# Patient Record
Sex: Female | Born: 1975 | Race: Black or African American | Hispanic: No | Marital: Married | Smoking: Never smoker
Health system: Southern US, Community
[De-identification: ages and names within clinical notes are randomized; demographics above are authoritative.]

## PROBLEM LIST (undated history)

## (undated) DIAGNOSIS — G43909 Migraine, unspecified, not intractable, without status migrainosus: Secondary | ICD-10-CM

## (undated) DIAGNOSIS — Z6841 Body Mass Index (BMI) 40.0 and over, adult: Secondary | ICD-10-CM

## (undated) HISTORY — DX: Migraine, unspecified, not intractable, without status migrainosus: G43.909

## (undated) HISTORY — PX: TONSILLECTOMY: SUR1361

---

## 2000-04-11 ENCOUNTER — Other Ambulatory Visit: Admission: RE | Admit: 2000-04-11 | Discharge: 2000-04-11 | Payer: Self-pay | Admitting: Obstetrics and Gynecology

## 2000-10-30 ENCOUNTER — Inpatient Hospital Stay (HOSPITAL_COMMUNITY): Admission: AD | Admit: 2000-10-30 | Discharge: 2000-11-03 | Payer: Self-pay | Admitting: Obstetrics and Gynecology

## 2001-04-18 ENCOUNTER — Other Ambulatory Visit: Admission: RE | Admit: 2001-04-18 | Discharge: 2001-04-18 | Payer: Self-pay | Admitting: Obstetrics and Gynecology

## 2002-05-14 ENCOUNTER — Other Ambulatory Visit: Admission: RE | Admit: 2002-05-14 | Discharge: 2002-05-14 | Payer: Self-pay | Admitting: Obstetrics and Gynecology

## 2003-05-27 ENCOUNTER — Other Ambulatory Visit: Admission: RE | Admit: 2003-05-27 | Discharge: 2003-05-27 | Payer: Self-pay | Admitting: Obstetrics and Gynecology

## 2004-05-27 ENCOUNTER — Other Ambulatory Visit: Admission: RE | Admit: 2004-05-27 | Discharge: 2004-05-27 | Payer: Self-pay | Admitting: Obstetrics and Gynecology

## 2004-06-22 ENCOUNTER — Encounter: Admission: RE | Admit: 2004-06-22 | Discharge: 2004-09-20 | Payer: Self-pay | Admitting: Obstetrics and Gynecology

## 2004-07-29 ENCOUNTER — Ambulatory Visit (HOSPITAL_COMMUNITY): Admission: RE | Admit: 2004-07-29 | Discharge: 2004-07-29 | Payer: Self-pay | Admitting: Obstetrics and Gynecology

## 2004-11-14 ENCOUNTER — Inpatient Hospital Stay (HOSPITAL_COMMUNITY): Admission: AD | Admit: 2004-11-14 | Discharge: 2004-11-14 | Payer: Self-pay | Admitting: Obstetrics and Gynecology

## 2005-01-07 ENCOUNTER — Inpatient Hospital Stay (HOSPITAL_COMMUNITY): Admission: AD | Admit: 2005-01-07 | Discharge: 2005-01-07 | Payer: Self-pay | Admitting: Obstetrics and Gynecology

## 2005-02-03 ENCOUNTER — Inpatient Hospital Stay (HOSPITAL_COMMUNITY): Admission: AD | Admit: 2005-02-03 | Discharge: 2005-02-08 | Payer: Self-pay | Admitting: Obstetrics and Gynecology

## 2005-02-09 ENCOUNTER — Encounter: Admission: RE | Admit: 2005-02-09 | Discharge: 2005-03-11 | Payer: Self-pay | Admitting: Obstetrics and Gynecology

## 2005-05-28 ENCOUNTER — Other Ambulatory Visit: Admission: RE | Admit: 2005-05-28 | Discharge: 2005-05-28 | Payer: Self-pay | Admitting: Obstetrics and Gynecology

## 2005-06-11 ENCOUNTER — Encounter: Admission: RE | Admit: 2005-06-11 | Discharge: 2005-09-09 | Payer: Self-pay | Admitting: Obstetrics and Gynecology

## 2010-06-21 ENCOUNTER — Encounter: Payer: Self-pay | Admitting: Obstetrics and Gynecology

## 2015-05-14 ENCOUNTER — Ambulatory Visit
Admission: RE | Admit: 2015-05-14 | Discharge: 2015-05-14 | Disposition: A | Discharge status: Home / Self Care | Payer: 59 | Source: Ambulatory Visit | Attending: Family Medicine | Admitting: Family Medicine

## 2015-05-14 ENCOUNTER — Other Ambulatory Visit: Payer: Self-pay | Admitting: Family Medicine

## 2015-05-14 DIAGNOSIS — R52 Pain, unspecified: Secondary | ICD-10-CM

## 2015-05-14 DIAGNOSIS — M7712 Lateral epicondylitis, left elbow: Secondary | ICD-10-CM

## 2015-10-06 ENCOUNTER — Telehealth: Payer: Self-pay | Admitting: Hematology

## 2015-10-06 ENCOUNTER — Encounter: Payer: Self-pay | Admitting: Hematology

## 2015-10-06 NOTE — Telephone Encounter (Signed)
Verified address and insurance, faxed referring provider date/time of appt, mailed new pt packet , scheduled intake

## 2015-10-21 ENCOUNTER — Telehealth: Payer: Self-pay | Admitting: Hematology

## 2015-10-21 ENCOUNTER — Ambulatory Visit (HOSPITAL_BASED_OUTPATIENT_CLINIC_OR_DEPARTMENT_OTHER): Payer: 59 | Admitting: Hematology

## 2015-10-21 ENCOUNTER — Encounter: Payer: Self-pay | Admitting: Hematology

## 2015-10-21 ENCOUNTER — Other Ambulatory Visit (HOSPITAL_BASED_OUTPATIENT_CLINIC_OR_DEPARTMENT_OTHER): Payer: 59

## 2015-10-21 VITALS — BP 134/87 | HR 85 | Temp 99.0°F | Resp 18 | Wt 270.3 lb

## 2015-10-21 DIAGNOSIS — D72828 Other elevated white blood cell count: Secondary | ICD-10-CM

## 2015-10-21 DIAGNOSIS — G43909 Migraine, unspecified, not intractable, without status migrainosus: Secondary | ICD-10-CM | POA: Insufficient documentation

## 2015-10-21 DIAGNOSIS — J309 Allergic rhinitis, unspecified: Secondary | ICD-10-CM

## 2015-10-21 DIAGNOSIS — E282 Polycystic ovarian syndrome: Secondary | ICD-10-CM

## 2015-10-21 DIAGNOSIS — E669 Obesity, unspecified: Secondary | ICD-10-CM

## 2015-10-21 DIAGNOSIS — D72829 Elevated white blood cell count, unspecified: Secondary | ICD-10-CM | POA: Insufficient documentation

## 2015-10-21 DIAGNOSIS — G43809 Other migraine, not intractable, without status migrainosus: Secondary | ICD-10-CM

## 2015-10-21 DIAGNOSIS — D509 Iron deficiency anemia, unspecified: Secondary | ICD-10-CM

## 2015-10-21 DIAGNOSIS — E559 Vitamin D deficiency, unspecified: Secondary | ICD-10-CM | POA: Insufficient documentation

## 2015-10-21 DIAGNOSIS — M7712 Lateral epicondylitis, left elbow: Secondary | ICD-10-CM | POA: Insufficient documentation

## 2015-10-21 LAB — CBC & DIFF AND RETIC
BASO%: 0.5 % (ref 0.0–2.0)
Basophils Absolute: 0.1 10*3/uL (ref 0.0–0.1)
EOS%: 1.5 % (ref 0.0–7.0)
Eosinophils Absolute: 0.2 10*3/uL (ref 0.0–0.5)
HCT: 36.9 % (ref 34.8–46.6)
HGB: 11.9 g/dL (ref 11.6–15.9)
Immature Retic Fract: 3.9 % (ref 1.60–10.00)
LYMPH#: 3.1 10*3/uL (ref 0.9–3.3)
LYMPH%: 21 % (ref 14.0–49.7)
MCH: 23.5 pg — ABNORMAL LOW (ref 25.1–34.0)
MCHC: 32.2 g/dL (ref 31.5–36.0)
MCV: 72.8 fL — ABNORMAL LOW (ref 79.5–101.0)
MONO#: 1 10*3/uL — ABNORMAL HIGH (ref 0.1–0.9)
MONO%: 7 % (ref 0.0–14.0)
NEUT%: 70 % (ref 38.4–76.8)
NEUTROS ABS: 10.4 10*3/uL — AB (ref 1.5–6.5)
Platelets: 287 10*3/uL (ref 145–400)
RBC: 5.07 10*6/uL (ref 3.70–5.45)
RDW: 16.5 % — AB (ref 11.2–14.5)
RETIC %: 0.84 % (ref 0.70–2.10)
RETIC CT ABS: 42.59 10*3/uL (ref 33.70–90.70)
WBC: 14.9 10*3/uL — ABNORMAL HIGH (ref 3.9–10.3)

## 2015-10-21 LAB — COMPREHENSIVE METABOLIC PANEL
ALT: 28 U/L (ref 0–55)
ANION GAP: 9 meq/L (ref 3–11)
AST: 14 U/L (ref 5–34)
Albumin: 3.8 g/dL (ref 3.5–5.0)
Alkaline Phosphatase: 99 U/L (ref 40–150)
BUN: 9.3 mg/dL (ref 7.0–26.0)
CHLORIDE: 108 meq/L (ref 98–109)
CO2: 23 meq/L (ref 22–29)
CREATININE: 0.8 mg/dL (ref 0.6–1.1)
Calcium: 9.3 mg/dL (ref 8.4–10.4)
EGFR: >90 mL/min/{1.73_m2} (ref >90)
Glucose: 84 mg/dl (ref 70–140)
Potassium: 3.6 mEq/L (ref 3.5–5.1)
Sodium: 141 mEq/L (ref 136–145)
Total Bilirubin: <0.3 mg/dL (ref 0.20–1.20)
Total Protein: 7.9 g/dL (ref 6.4–8.3)

## 2015-10-21 LAB — CHCC SMEAR

## 2015-10-21 LAB — LACTATE DEHYDROGENASE: LDH: 194 U/L (ref 125–245)

## 2015-10-21 NOTE — Telephone Encounter (Signed)
Gave pt apt & avs °

## 2015-10-22 LAB — SEDIMENTATION RATE: SED RATE: 35 mm/h — AB (ref 0–32)

## 2015-10-22 LAB — C-REACTIVE PROTEIN: CRP: 14.4 mg/L — ABNORMAL HIGH (ref 0.0–4.9)

## 2015-10-22 NOTE — Progress Notes (Signed)
Marland Kitchen    HEMATOLOGY/ONCOLOGY CONSULTATION NOTE  Date of Service: 10/22/2015  PCP Rachell Cipro MD   CHIEF COMPLAINTS/PURPOSE OF CONSULTATION:  Leukocytosis  HISTORY OF PRESENTING ILLNESS:   Katherine Cox is a wonderful 40 y.o. female who has been referred to [place] by Dr Rachell Cipro for evaluation and management of leucocytosis/neutrophilia.  Patient has a history of morbid obesity, polycystic ovarian syndrome, iron deficiency and allergic rhinitis who reports that her WBC counts have been noted to be incidentally elevated on the last couple of labs at least over the last year. Hasn't been any overt infections, trauma or recent surgery. She has not required antibiotics. Does have issues with seasonal allergies especially in spring and takes Claritin and Singulair for these. Has not been on steroids. Has never been a smoker and denies significant secondhand smoke exposure. She reports that she is trying to exercise and has lost about 4-6 pounds but is still in about 270 pounds body weight. Was noted to be iron deficient with microcytic anemia and has been on oral iron as per her primary care physician but reports taking it less frequently than prescribed due to some constipation. Her periods have been relatively light since she has been on Depo-Provera for the last 2 years.  Reports no family history of anemia or hemoglobin disorders. She has had a tennis elbow that is being worked up and treated.  She reports no fevers no chills no night sweats no unexpected weight loss as no enlarged lymph nodes. No new bone pains no issues with bleeding or bruising. No other focal symptoms suggestive of infection.  Her CBC today showed hemoglobin of 11.9 with an MCV of 73, elevated WBC count of 14.9k with 10.4k neutrophils.   MEDICAL HISTORY:   #1  Polycystic ovarian syndrome  #2 migraine headaches #3 vitamin D deficiency #4 iron deficiency #5 morbid obesity #6 allergic rhinitis #7 left  elbow epicondylitis    SURGICAL HISTORY:  #1 tonsillectomy   #2 C-sections x 2, 10 and 15 years ago.  SOCIAL HISTORY: Social History   Social History  . Marital Status: Married    Spouse Name: N/A  . Number of Children: N/A  . Years of Education: N/A   Occupational History  . Not on file.   Social History Main Topics  . Smoking status: Not on file  . Smokeless tobacco: Not on file  . Alcohol Use: Not on file  . Drug Use: Not on file  . Sexual Activity: Not Currently   Other Topics Concern  . Not on file   Social History Narrative  . No narrative on file    FAMILY HISTORY: History reviewed. No pertinent family history.  ALLERGIES:  is allergic to codeine and penicillins.   MEDICATIONS:  Current Outpatient Prescriptions  Medication Sig Dispense Refill  . diclofenac sodium (VOLTAREN) 1 % GEL Apply topically 4 (four) times daily.    Marland Kitchen loratadine (CLARITIN) 10 MG tablet Take 10 mg by mouth daily.    . Multiple Vitamins-Minerals (MULTIVITAMIN WITH MINERALS) tablet Take 1 tablet by mouth daily.    . ondansetron (ZOFRAN-ODT) 4 MG disintegrating tablet Take 4 mg by mouth every 8 (eight) hours as needed for nausea or vomiting.    . SUMAtriptan (IMITREX) 100 MG tablet Take 100 mg by mouth every 2 (two) hours as needed for migraine. May repeat in 2 hours if headache persists or recurs.    Marland Kitchen azelastine (ASTELIN) 0.1 % nasal spray U 2 SPRAYS IEN BID  11  . ibuprofen (ADVIL,MOTRIN) 800 MG tablet   0  . MedroxyPROGESTERone Acetate 150 MG/ML SUSY   0  . montelukast (SINGULAIR) 10 MG tablet   3   No current facility-administered medications for this visit.    REVIEW OF SYSTEMS:    10 Point review of Systems was done is negative except as noted above.  PHYSICAL EXAMINATION: ECOG PERFORMANCE STATUS: 1 - Symptomatic but completely ambulatory  . Filed Vitals:   10/21/15 1426  BP: 134/87  Pulse: 85  Temp: 99 F (37.2 C)  Resp: 18   Filed Weights   10/21/15 1426    Weight: 270 lb 4.8 oz (122.607 kg)   .There is no height on file to calculate BMI.  GENERAL:alert, in no acute distress and comfortable SKIN: skin color, texture, turgor are normal, no rashes or significant lesions EYES: normal, conjunctiva are pink and non-injected, sclera clear OROPHARYNX:no exudate, no erythema and lips, buccal mucosa, and tongue normal  NECK: supple, no JVD, thyroid normal size, non-tender, without nodularity LYMPH:  no palpable lymphadenopathy in the cervical, axillary or inguinal LUNGS: clear to auscultation with normal respiratory effort HEART: regular rate & rhythm,  no murmurs and no lower extremity edema ABDOMEN: abdomen Obese, nondistended, non-tender, normoactive bowel sounds no palpable hepatosplenomegaly Musculoskeletal: no cyanosis of digits and no clubbing  PSYCH: alert & oriented x 3 with fluent speech NEURO: no focal motor/sensory deficits  LABORATORY DATA:  I have reviewed the data as listed  . CBC Latest Ref Rng 10/21/2015  WBC 3.9 - 10.3 10e3/uL 14.9(H)  Hemoglobin 11.6 - 15.9 g/dL 11.9  Hematocrit 34.8 - 46.6 % 36.9  Platelets 145 - 400 10e3/uL 287   . CBC    Component Value Date/Time   WBC 14.9* 10/21/2015 1516   RBC 5.07 10/21/2015 1516   HGB 11.9 10/21/2015 1516   HCT 36.9 10/21/2015 1516   PLT 287 10/21/2015 1516   MCV 72.8* 10/21/2015 1516   MCH 23.5* 10/21/2015 1516   MCHC 32.2 10/21/2015 1516   RDW 16.5* 10/21/2015 1516   LYMPHSABS 3.1 10/21/2015 1516   MONOABS 1.0* 10/21/2015 1516   EOSABS 0.2 10/21/2015 1516   BASOSABS 0.1 10/21/2015 1516  ANC 10.4k  CMP Latest Ref Rng 10/21/2015  Glucose 70 - 140 mg/dl 84  BUN 7.0 - 26.0 mg/dL 9.3  Creatinine 0.6 - 1.1 mg/dL 0.8  Sodium 136 - 145 mEq/L 141  Potassium 3.5 - 5.1 mEq/L 3.6  CO2 22 - 29 mEq/L 23  Calcium 8.4 - 10.4 mg/dL 9.3  Total Protein 6.4 - 8.3 g/dL 7.9  Total Bilirubin 0.20 - 1.20 mg/dL <0.30  Alkaline Phos 40 - 150 U/L 99  AST 5 - 34 U/L 14  ALT 0 - 55 U/L  28   . Lab Results  Component Value Date   LDH 194 10/21/2015   Component     Latest Ref Rng 10/21/2015  Sed Rate     0 - 32 mm/hr 35 (H)  CRP     0.0 - 4.9 mg/L 14.4 (H)   Peripheral blood smear Somewhat microcytic red cells, nontender schistocytes. Adequate platelets. No platelet clumping. Mild neutrophilia with appropriate maturation. No increased blasts.  RADIOGRAPHIC STUDIES : I have personally reviewed the radiological images as listed and agreed with the findings in the report. No results found.  ASSESSMENT & PLAN:   40 year old African-American female with   1) mild chronic nonprogressive neutrophilia. This appears to be likely related to her polycystic ovarian syndrome  and obesity. PCOS has been associated with a chronic inflammatory state as demonstrated by her elevated sedimentation rate and CRP and chronic leukocytosis. This is in fact considered an early marker for cardiovascular risk. (J Clin Endocrinol Metab (2005) 90 (1): 2-5.)  No overt evidence of infection at this time. Patient has been on lifelong nonsmoker. No specific focal symptoms suggestive of a chronic inflammation though she does have lateral epicondylitis. She also notes seasonal allergic rhinitis. Given the patient's age and absence of hepatosplenomegaly bone pains or other cytosis the chances of this being a myeloproliferative neoplasm would be quite unlikely.  LDH level within normal limits. No palpable lymphadenopathy. No symptoms of lymphoma at this time. Plan -Would recommend aggressive management of her cardiovascular risk factors. -I discussed at length importance of healthy weight loss and exercise and the fact that her leukocytosis is likely related to her obesity and polycystic ovarian syndrome. -Consider TSH with primary care physician . -No indication for bone marrow biopsy at this time  -If there is significant increase in leukocytosis along with other constitutional symptoms or  splenomegaly would consider revisiting the need for additional workup including bone marrow biopsy and getting clonal markers of MPN  2) microcytic anemia - likely from iron deficiency. Cannot rule out an element of anemia of chronic disease. Plan - oral iron replacement as per primary care physician. -Might be prudent to switch to iron polysaccharide for better GI tolerance and uses Colace as needed for constipation. -If persistent microcytosis despite ferritin levels close to 100 would recommend getting hemoglobin electrophoresis to rule out alpha thal trait  Continue follow-up with primary care physician. Return to care with Dr. Irene Limbo if any new questions or concerns arise.  All of the patients questions were answeredto her  apparent satisfaction. The patient knows to call the clinic with any problems, questions or concerns.  I spent 60 minutes counseling the patient face to face. The total time spent in the appointment was 60 minutes and more than 50% was on counseling and direct patient cares.    Sullivan Lone MD [place] AAHIVMS [facility] Hematology/Oncology Physician [facility]  (Office):       [phone] (Work cell):  [phone] (Fax):           [phone]  10/22/2015 11:47 AM

## 2015-10-23 ENCOUNTER — Telehealth: Payer: Self-pay | Admitting: *Deleted

## 2015-10-23 NOTE — Telephone Encounter (Signed)
Patient called inquiring about labs. Patient would like for you to explain her labs to her via phone. Mobile # is preferred.

## 2016-02-06 ENCOUNTER — Other Ambulatory Visit: Payer: Self-pay | Admitting: Family Medicine

## 2016-02-06 DIAGNOSIS — Z1231 Encounter for screening mammogram for malignant neoplasm of breast: Secondary | ICD-10-CM

## 2016-02-17 ENCOUNTER — Ambulatory Visit: Payer: 59

## 2016-05-12 ENCOUNTER — Ambulatory Visit (INDEPENDENT_AMBULATORY_CARE_PROVIDER_SITE_OTHER): Payer: 59

## 2016-05-12 ENCOUNTER — Ambulatory Visit (INDEPENDENT_AMBULATORY_CARE_PROVIDER_SITE_OTHER): Payer: 59 | Admitting: Podiatry

## 2016-05-12 ENCOUNTER — Encounter: Payer: Self-pay | Admitting: Podiatry

## 2016-05-12 VITALS — HR 86 | Resp 16 | Ht 66.0 in | Wt 266.0 lb

## 2016-05-12 DIAGNOSIS — M722 Plantar fascial fibromatosis: Secondary | ICD-10-CM

## 2016-05-12 MED ORDER — TRIAMCINOLONE ACETONIDE 10 MG/ML IJ SUSP
10.0000 mg | Freq: Once | INTRAMUSCULAR | Status: AC
  Administered 2016-05-12: 10 mg

## 2016-05-12 NOTE — Progress Notes (Signed)
Subjective:     Patient ID: Katherine Cox, female   DOB: Apr 02, 1976, 40 y.o.   MRN: [id number]  HPI patient states that she's had pain in the right over left arch and that it's stopped her from being active for the last month and she likes to run and do boot camp   Review of Systems  All other systems reviewed and are negative.      Objective:   Physical Exam  Constitutional: She is oriented to person, place, and time.  Cardiovascular: Intact distal pulses.   Musculoskeletal: Normal range of motion.  Neurological: She is oriented to person, place, and time.  Skin: Skin is warm.  Nursing note and vitals reviewed.  neurovascular status intact muscle strength adequate range of motion within normal limits with patient noted to have discomfort in the plantar arch more proximal portion right over left with inflammation noted. 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 She'll be seen back when orthotics are ready  X-ray indicates that there is no spurring with mild depression of the arch and no other significant pathology except for large spurring on the posterior heel left over right

## 2016-05-28 ENCOUNTER — Ambulatory Visit (INDEPENDENT_AMBULATORY_CARE_PROVIDER_SITE_OTHER): Payer: 59 | Admitting: Podiatry

## 2016-05-28 DIAGNOSIS — M722 Plantar fascial fibromatosis: Secondary | ICD-10-CM

## 2016-05-28 MED ORDER — TRIAMCINOLONE ACETONIDE 10 MG/ML IJ SUSP
10.0000 mg | Freq: Once | INTRAMUSCULAR | Status: AC
  Administered 2016-05-28: 10 mg

## 2016-05-28 NOTE — Progress Notes (Signed)
Subjective:     Patient ID: Katherine Cox, female   DOB: Sep 11, 1975, 40 y.o.   MRN: [id number]  HPI patient states the bottom of the heel has still been hurting her with some discomfort but improved   Review of Systems     Objective:   Physical Exam Neurovascular status intact muscle strength adequate with discomfort in the plantar aspect of the left heel at the insertional point of the tendon into the calcaneus with inflammation fluid still noted    Assessment:     Planter fasciitis left improved but present    Plan:     Reviewed continuation of stretching exercises anti-inflammatories and carefully reinjected the plantar fascia 3 mg Kenalog 5 mg Xylocaine and we will wait for the orthotics which should be here in the next several weeks

## 2017-02-10 ENCOUNTER — Other Ambulatory Visit: Payer: Self-pay | Admitting: Family Medicine

## 2017-02-10 DIAGNOSIS — Z1231 Encounter for screening mammogram for malignant neoplasm of breast: Secondary | ICD-10-CM

## 2017-02-17 ENCOUNTER — Ambulatory Visit
Admission: RE | Admit: 2017-02-17 | Discharge: 2017-02-17 | Disposition: A | Discharge status: Home / Self Care | Payer: 59 | Source: Ambulatory Visit | Attending: Family Medicine | Admitting: Family Medicine

## 2017-02-17 DIAGNOSIS — Z1231 Encounter for screening mammogram for malignant neoplasm of breast: Secondary | ICD-10-CM

## 2017-02-18 ENCOUNTER — Other Ambulatory Visit: Payer: Self-pay | Admitting: Family Medicine

## 2017-02-18 DIAGNOSIS — R928 Other abnormal and inconclusive findings on diagnostic imaging of breast: Secondary | ICD-10-CM

## 2017-02-25 ENCOUNTER — Other Ambulatory Visit: Payer: Self-pay | Admitting: Family Medicine

## 2017-02-25 ENCOUNTER — Ambulatory Visit
Admission: RE | Admit: 2017-02-25 | Discharge: 2017-02-25 | Disposition: A | Discharge status: Home / Self Care | Payer: 59 | Source: Ambulatory Visit | Attending: Family Medicine | Admitting: Family Medicine

## 2017-02-25 DIAGNOSIS — R599 Enlarged lymph nodes, unspecified: Secondary | ICD-10-CM

## 2017-02-25 DIAGNOSIS — R928 Other abnormal and inconclusive findings on diagnostic imaging of breast: Secondary | ICD-10-CM

## 2017-02-28 ENCOUNTER — Ambulatory Visit
Admission: RE | Admit: 2017-02-28 | Discharge: 2017-02-28 | Disposition: A | Discharge status: Home / Self Care | Payer: 59 | Source: Ambulatory Visit | Attending: Family Medicine | Admitting: Family Medicine

## 2017-02-28 ENCOUNTER — Other Ambulatory Visit: Payer: Self-pay | Admitting: Family Medicine

## 2017-02-28 DIAGNOSIS — R599 Enlarged lymph nodes, unspecified: Secondary | ICD-10-CM

## 2017-03-03 ENCOUNTER — Other Ambulatory Visit: Payer: 59

## 2018-05-16 ENCOUNTER — Other Ambulatory Visit: Payer: Self-pay | Admitting: Obstetrics & Gynecology

## 2018-12-20 ENCOUNTER — Other Ambulatory Visit: Payer: Self-pay | Admitting: Obstetrics and Gynecology

## 2018-12-22 ENCOUNTER — Other Ambulatory Visit (HOSPITAL_COMMUNITY): Admission: RE | Admit: 2018-12-22 | Payer: 59 | Source: Ambulatory Visit

## 2018-12-22 NOTE — H&P (Signed)
  Reason for admission:   Desire for sterilization  History:     Katherine Cox is a 43 y.o.  G2P2 s/p cesarean x2 here to undergo sterilization with laparoscopic bilateral salpyngectomy with removal of IUD.  Patient is very dissatisfied of Mirena (inserted 12/2015) with recurrent DUB lasting 7-10 days and significant hair loss for the last year.    Review of system:  Non-contributory   Past Medical History:   Past Medical History:  Diagnosis Date  . Migraines     Allergies:  Codeine and penicillin  Social History:   Married, non-smoker and employed at [employer]  Family History:   Mother with CHTN and diabetes  Physical exam:    General Appearance: Alert, appropriate appearance for age. No acute distress Neck / Thyroid: Supple, no masses, nodes or enlargement Lungs: clear to auscultation bilaterally Back: No CVA tenderness. Cardiovascular: Regular rate and rhythm. S1, S2, no murmur Gastrointestinal: Soft, non-tender, no masses or organomegaly    Assessment:   Desire for sterilization   Plan:    Proceed with laparoscopic bilateral salpyngectomy and removal of IUD  Procedure, risks and benefits reviewed with patient including but not limited to bleeding, infection, injury to other organs, failure rate of 1:1000 Patient voices understanding and is agreeable to proceed.      Delsa Bern A MD

## 2018-12-23 ENCOUNTER — Other Ambulatory Visit (HOSPITAL_COMMUNITY)
Admission: RE | Admit: 2018-12-23 | Discharge: 2018-12-23 | Disposition: A | Discharge status: Home / Self Care | Payer: 59 | Source: Ambulatory Visit | Attending: Obstetrics and Gynecology | Admitting: Obstetrics and Gynecology

## 2018-12-23 DIAGNOSIS — Z1159 Encounter for screening for other viral diseases: Secondary | ICD-10-CM | POA: Diagnosis present

## 2018-12-23 LAB — SARS CORONAVIRUS 2 (TAT 6-24 HRS): SARS Coronavirus 2: NEGATIVE

## 2018-12-25 ENCOUNTER — Encounter (HOSPITAL_BASED_OUTPATIENT_CLINIC_OR_DEPARTMENT_OTHER): Payer: Self-pay | Admitting: *Deleted

## 2018-12-25 ENCOUNTER — Encounter (HOSPITAL_BASED_OUTPATIENT_CLINIC_OR_DEPARTMENT_OTHER)
Admission: RE | Admit: 2018-12-25 | Discharge: 2018-12-25 | Disposition: A | Discharge status: Home / Self Care | Payer: 59 | Source: Ambulatory Visit | Attending: Obstetrics and Gynecology | Admitting: Obstetrics and Gynecology

## 2018-12-25 ENCOUNTER — Other Ambulatory Visit: Payer: Self-pay

## 2018-12-25 DIAGNOSIS — G43909 Migraine, unspecified, not intractable, without status migrainosus: Secondary | ICD-10-CM | POA: Diagnosis not present

## 2018-12-25 DIAGNOSIS — Z6841 Body Mass Index (BMI) 40.0 and over, adult: Secondary | ICD-10-CM | POA: Diagnosis not present

## 2018-12-25 DIAGNOSIS — Z885 Allergy status to narcotic agent status: Secondary | ICD-10-CM | POA: Diagnosis not present

## 2018-12-25 DIAGNOSIS — Z01812 Encounter for preprocedural laboratory examination: Secondary | ICD-10-CM | POA: Insufficient documentation

## 2018-12-25 DIAGNOSIS — Z833 Family history of diabetes mellitus: Secondary | ICD-10-CM | POA: Diagnosis not present

## 2018-12-25 DIAGNOSIS — L659 Nonscarring hair loss, unspecified: Secondary | ICD-10-CM | POA: Diagnosis not present

## 2018-12-25 DIAGNOSIS — Z880 Allergy status to penicillin: Secondary | ICD-10-CM | POA: Diagnosis not present

## 2018-12-25 DIAGNOSIS — Z302 Encounter for sterilization: Secondary | ICD-10-CM | POA: Diagnosis present

## 2018-12-25 DIAGNOSIS — N938 Other specified abnormal uterine and vaginal bleeding: Secondary | ICD-10-CM | POA: Diagnosis not present

## 2018-12-25 LAB — CBC
HCT: 39.5 % (ref 36.0–46.0)
Hemoglobin: 12.6 g/dL (ref 12.0–15.0)
MCH: 23.6 pg — ABNORMAL LOW (ref 26.0–34.0)
MCHC: 31.9 g/dL (ref 30.0–36.0)
MCV: 73.8 fL — ABNORMAL LOW (ref 80.0–100.0)
Platelets: 285 10*3/uL (ref 150–400)
RBC: 5.35 MIL/uL — ABNORMAL HIGH (ref 3.87–5.11)
RDW: 16.6 % — ABNORMAL HIGH (ref 11.5–15.5)
WBC: 14 10*3/uL — ABNORMAL HIGH (ref 4.0–10.5)
nRBC: 0 % (ref 0.0–0.2)

## 2018-12-25 LAB — POCT PREGNANCY, URINE: Preg Test, Ur: NEGATIVE

## 2018-12-26 ENCOUNTER — Ambulatory Visit (HOSPITAL_BASED_OUTPATIENT_CLINIC_OR_DEPARTMENT_OTHER): Payer: 59 | Admitting: Anesthesiology

## 2018-12-26 ENCOUNTER — Other Ambulatory Visit: Payer: Self-pay

## 2018-12-26 ENCOUNTER — Encounter (HOSPITAL_BASED_OUTPATIENT_CLINIC_OR_DEPARTMENT_OTHER)
Admission: RE | Disposition: A | Discharge status: Home / Self Care | Payer: Self-pay | Source: Home / Self Care | Attending: Obstetrics and Gynecology

## 2018-12-26 ENCOUNTER — Encounter (HOSPITAL_BASED_OUTPATIENT_CLINIC_OR_DEPARTMENT_OTHER): Payer: Self-pay | Admitting: *Deleted

## 2018-12-26 ENCOUNTER — Ambulatory Visit (HOSPITAL_BASED_OUTPATIENT_CLINIC_OR_DEPARTMENT_OTHER)
Admission: RE | Admit: 2018-12-26 | Discharge: 2018-12-26 | Disposition: A | Discharge status: Home / Self Care | Payer: 59 | Attending: Obstetrics and Gynecology | Admitting: Obstetrics and Gynecology

## 2018-12-26 DIAGNOSIS — Z302 Encounter for sterilization: Secondary | ICD-10-CM

## 2018-12-26 DIAGNOSIS — Z880 Allergy status to penicillin: Secondary | ICD-10-CM | POA: Insufficient documentation

## 2018-12-26 DIAGNOSIS — N938 Other specified abnormal uterine and vaginal bleeding: Secondary | ICD-10-CM | POA: Insufficient documentation

## 2018-12-26 DIAGNOSIS — Z833 Family history of diabetes mellitus: Secondary | ICD-10-CM | POA: Insufficient documentation

## 2018-12-26 DIAGNOSIS — Z885 Allergy status to narcotic agent status: Secondary | ICD-10-CM | POA: Insufficient documentation

## 2018-12-26 DIAGNOSIS — L659 Nonscarring hair loss, unspecified: Secondary | ICD-10-CM | POA: Insufficient documentation

## 2018-12-26 DIAGNOSIS — G43909 Migraine, unspecified, not intractable, without status migrainosus: Secondary | ICD-10-CM | POA: Insufficient documentation

## 2018-12-26 DIAGNOSIS — Z6841 Body Mass Index (BMI) 40.0 and over, adult: Secondary | ICD-10-CM | POA: Insufficient documentation

## 2018-12-26 HISTORY — PX: IUD REMOVAL: SHX5392

## 2018-12-26 HISTORY — DX: Body Mass Index (BMI) 40.0 and over, adult: Z684

## 2018-12-26 HISTORY — PX: LAPAROSCOPIC BILATERAL SALPINGECTOMY: SHX5889

## 2018-12-26 SURGERY — SALPINGECTOMY, BILATERAL, LAPAROSCOPIC
Anesthesia: General | Site: Uterus | Laterality: Bilateral

## 2018-12-26 MED ORDER — FENTANYL CITRATE (PF) 100 MCG/2ML IJ SOLN: 50.0000 ug | INTRAMUSCULAR | Status: DC | PRN

## 2018-12-26 MED ORDER — PROPOFOL 500 MG/50ML IV EMUL
INTRAVENOUS | Status: AC
  Filled 2018-12-26: qty 50

## 2018-12-26 MED ORDER — ONDANSETRON HCL 4 MG/2ML IJ SOLN
INTRAMUSCULAR | Status: AC
  Filled 2018-12-26: qty 2

## 2018-12-26 MED ORDER — SUGAMMADEX SODIUM 500 MG/5ML IV SOLN
INTRAVENOUS | Status: DC | PRN
  Administered 2018-12-26: 500 mg via INTRAVENOUS

## 2018-12-26 MED ORDER — ONDANSETRON HCL 4 MG/2ML IJ SOLN
INTRAMUSCULAR | Status: DC | PRN
  Administered 2018-12-26: 4 mg via INTRAVENOUS

## 2018-12-26 MED ORDER — FENTANYL CITRATE (PF) 100 MCG/2ML IJ SOLN
INTRAMUSCULAR | Status: AC
  Filled 2018-12-26: qty 2

## 2018-12-26 MED ORDER — SCOPOLAMINE 1 MG/3DAYS TD PT72: 1.0000 | MEDICATED_PATCH | Freq: Once | TRANSDERMAL | Status: DC

## 2018-12-26 MED ORDER — DEXAMETHASONE SODIUM PHOSPHATE 10 MG/ML IJ SOLN
INTRAMUSCULAR | Status: AC
  Filled 2018-12-26: qty 1

## 2018-12-26 MED ORDER — LIDOCAINE HCL (CARDIAC) PF 100 MG/5ML IV SOSY
PREFILLED_SYRINGE | INTRAVENOUS | Status: DC | PRN
  Administered 2018-12-26: 100 mg via INTRAVENOUS

## 2018-12-26 MED ORDER — MIDAZOLAM HCL 2 MG/2ML IJ SOLN: 1.0000 mg | INTRAMUSCULAR | Status: DC | PRN

## 2018-12-26 MED ORDER — FENTANYL CITRATE (PF) 250 MCG/5ML IJ SOLN
INTRAMUSCULAR | Status: DC | PRN
  Administered 2018-12-26: 50 ug via INTRAVENOUS
  Administered 2018-12-26: 100 ug via INTRAVENOUS
  Administered 2018-12-26: 50 ug via INTRAVENOUS

## 2018-12-26 MED ORDER — ROCURONIUM BROMIDE 10 MG/ML (PF) SYRINGE
PREFILLED_SYRINGE | INTRAVENOUS | Status: DC | PRN
  Administered 2018-12-26 (×2): 50 mg via INTRAVENOUS

## 2018-12-26 MED ORDER — DEXAMETHASONE SODIUM PHOSPHATE 10 MG/ML IJ SOLN
INTRAMUSCULAR | Status: DC | PRN
  Administered 2018-12-26: 10 mg via INTRAVENOUS

## 2018-12-26 MED ORDER — BUPIVACAINE HCL (PF) 0.25 % IJ SOLN
INTRAMUSCULAR | Status: DC | PRN
  Administered 2018-12-26: 29 mL

## 2018-12-26 MED ORDER — SUCCINYLCHOLINE CHLORIDE 200 MG/10ML IV SOSY
PREFILLED_SYRINGE | INTRAVENOUS | Status: DC | PRN
  Administered 2018-12-26: 120 mg via INTRAVENOUS

## 2018-12-26 MED ORDER — LACTATED RINGERS IV SOLN
INTRAVENOUS | Status: DC
  Administered 2018-12-26 (×2): via INTRAVENOUS

## 2018-12-26 MED ORDER — TRAMADOL HCL 50 MG PO TABS: 50.0000 mg | ORAL_TABLET | Freq: Four times a day (QID) | ORAL | 0 refills | Status: AC | PRN

## 2018-12-26 MED ORDER — MIDAZOLAM HCL 2 MG/2ML IJ SOLN
INTRAMUSCULAR | Status: AC
  Filled 2018-12-26: qty 2

## 2018-12-26 MED ORDER — LIDOCAINE 2% (20 MG/ML) 5 ML SYRINGE
INTRAMUSCULAR | Status: AC
  Filled 2018-12-26: qty 5

## 2018-12-26 MED ORDER — ROCURONIUM BROMIDE 10 MG/ML (PF) SYRINGE
PREFILLED_SYRINGE | INTRAVENOUS | Status: AC
  Filled 2018-12-26: qty 10

## 2018-12-26 MED ORDER — FENTANYL CITRATE (PF) 100 MCG/2ML IJ SOLN: 25.0000 ug | INTRAMUSCULAR | Status: DC | PRN

## 2018-12-26 MED ORDER — MIDAZOLAM HCL 2 MG/2ML IJ SOLN
INTRAMUSCULAR | Status: DC | PRN
  Administered 2018-12-26: 2 mg via INTRAVENOUS

## 2018-12-26 MED ORDER — PHENYLEPHRINE 40 MCG/ML (10ML) SYRINGE FOR IV PUSH (FOR BLOOD PRESSURE SUPPORT)
PREFILLED_SYRINGE | INTRAVENOUS | Status: DC | PRN
  Administered 2018-12-26: 80 ug via INTRAVENOUS

## 2018-12-26 MED ORDER — KETOROLAC TROMETHAMINE 30 MG/ML IJ SOLN: 30.0000 mg | Freq: Once | INTRAMUSCULAR | Status: DC | PRN

## 2018-12-26 MED ORDER — PROMETHAZINE HCL 25 MG/ML IJ SOLN: 6.2500 mg | INTRAMUSCULAR | Status: DC | PRN

## 2018-12-26 MED ORDER — SUCCINYLCHOLINE CHLORIDE 200 MG/10ML IV SOSY
PREFILLED_SYRINGE | INTRAVENOUS | Status: AC
  Filled 2018-12-26: qty 10

## 2018-12-26 MED ORDER — CHLOROPROCAINE HCL 1 % IJ SOLN
INTRAMUSCULAR | Status: AC
  Filled 2018-12-26: qty 30

## 2018-12-26 MED ORDER — PROPOFOL 10 MG/ML IV BOLUS
INTRAVENOUS | Status: DC | PRN
  Administered 2018-12-26: 160 mg via INTRAVENOUS
  Administered 2018-12-26: 40 mg via INTRAVENOUS

## 2018-12-26 SURGICAL SUPPLY — 35 items
ADH SKN CLS APL DERMABOND .7 (GAUZE/BANDAGES/DRESSINGS) ×3
CATH ROBINSON RED A/P 16FR (CATHETERS) ×5 IMPLANT
DERMABOND ADVANCED (GAUZE/BANDAGES/DRESSINGS) ×2
DERMABOND ADVANCED .7 DNX12 (GAUZE/BANDAGES/DRESSINGS) ×3 IMPLANT
DRSG OPSITE POSTOP 3X4 (GAUZE/BANDAGES/DRESSINGS) ×2 IMPLANT
DURAPREP 26ML APPLICATOR (WOUND CARE) ×5 IMPLANT
ELECT REM PT RETURN 9FT ADLT (ELECTROSURGICAL) ×5
ELECTRODE REM PT RTRN 9FT ADLT (ELECTROSURGICAL) ×3 IMPLANT
FORCEPS CUTTING 33CM 5MM (CUTTING FORCEPS) ×2 IMPLANT
FORCEPS CUTTING 45CM 5MM (CUTTING FORCEPS) IMPLANT
GAUZE 4X4 16PLY RFD (DISPOSABLE) ×2 IMPLANT
GLOVE BIOGEL PI IND STRL 7.0 (GLOVE) ×9 IMPLANT
GLOVE BIOGEL PI INDICATOR 7.0 (GLOVE) ×6
GLOVE ECLIPSE 6.5 STRL STRAW (GLOVE) ×5 IMPLANT
GOWN STRL REUS W/ TWL LRG LVL3 (GOWN DISPOSABLE) ×6 IMPLANT
GOWN STRL REUS W/TWL LRG LVL3 (GOWN DISPOSABLE) ×10
HIBICLENS CHG 4% 4OZ BTL (MISCELLANEOUS) ×5 IMPLANT
NDL SPNL 22GX7 QUINCKE BK (NEEDLE) IMPLANT
NEEDLE SPNL 22GX7 QUINCKE BK (NEEDLE) IMPLANT
PACK LAPAROSCOPY BASIN (CUSTOM PROCEDURE TRAY) ×5 IMPLANT
PACK TRENDGUARD 450 HYBRID PRO (MISCELLANEOUS) IMPLANT
PENCIL BUTTON HOLSTER BLD 10FT (ELECTRODE) IMPLANT
SET IRRIG TUBING LAPAROSCOPIC (IRRIGATION / IRRIGATOR) IMPLANT
SET TUBE SMOKE EVAC HIGH FLOW (TUBING) ×5 IMPLANT
SLEEVE XCEL OPT CAN 5 100 (ENDOMECHANICALS) ×5 IMPLANT
SOLUTION ELECTROLUBE (MISCELLANEOUS) IMPLANT
SUT MNCRL AB 3-0 PS2 27 (SUTURE) ×5 IMPLANT
SUT VIC AB 3-0 SH 27 (SUTURE)
SUT VIC AB 3-0 SH 27X BRD (SUTURE) IMPLANT
SUT VICRYL 0 UR6 27IN ABS (SUTURE) ×5 IMPLANT
TOWEL GREEN STERILE FF (TOWEL DISPOSABLE) ×10 IMPLANT
TRENDGUARD 450 HYBRID PRO PACK (MISCELLANEOUS) ×5
TROCAR BALLN 12MMX100 BLUNT (TROCAR) ×5 IMPLANT
TROCAR XCEL NON-BLD 5MMX100MML (ENDOMECHANICALS) ×5 IMPLANT
WARMER LAPAROSCOPE (MISCELLANEOUS) ×5 IMPLANT

## 2018-12-26 NOTE — Discharge Instructions (Signed)
POST-OPERATIVE INSTRUCTIONS TO PATIENT  Call [facility]  [phone])  for excessive pain, bleeding or temperature greater than or equal to 100.4 degrees (orally).    No driving for 1 day No lifting (more than 20 lbs) for 2 weeks No sexual activity for 2 weeks  Pain management:  Use Ibuprofen 600 mg every 6 hours for 5 days and then as needed. Use your pain medication as needed to maintain a pain level at or below 3/10 Use Colace 1-2 capsules per day as long as you are using pain  medication to avoid constipation.       Diet: normal  Bathing: may shower day after surgery  Wound Care: keep incisions clean and dry  Return to Dr. Cletis Media as scheduled      Dede Query Rivard MD 12/26/2018 3:21 PM     Post Anesthesia Home Care Instructions  Activity: Get plenty of rest for the remainder of the day. A responsible individual must stay with you for 24 hours following the procedure.  For the next 24 hours, DO NOT: -Drive a car -Paediatric nurse -Drink alcoholic beverages -Take any medication unless instructed by your physician -Make any legal decisions or sign important papers.  Meals: Start with liquid foods such as gelatin or soup. Progress to regular foods as tolerated. Avoid greasy, spicy, heavy foods. If nausea and/or vomiting occur, drink only clear liquids until the nausea and/or vomiting subsides. Call your physician if vomiting continues.  Special Instructions/Symptoms: Your throat may feel dry or sore from the anesthesia or the breathing tube placed in your throat during surgery. If this causes discomfort, gargle with warm salt water. The discomfort should disappear within 24 hours.  If you had a scopolamine patch placed behind your ear for the management of post- operative nausea and/or vomiting:  1. The medication in the patch is effective for 72 hours, after which it should be removed.  Wrap patch in a tissue and discard in the trash. Wash hands  thoroughly with soap and water. 2. You may remove the patch earlier than 72 hours if you experience unpleasant side effects which may include dry mouth, dizziness or visual disturbances. 3. Avoid touching the patch. Wash your hands with soap and water after contact with the patch.

## 2018-12-26 NOTE — Interval H&P Note (Signed)
History and Physical Interval Note:  12/26/2018 12:38 PM  Katherine Cox  has presented today for surgery, with the diagnosis of Desire for Surgical Sterlization.  The various methods of treatment have been discussed with the patient and family. After consideration of risks, benefits and other options for treatment, the patient has consented to  Procedure(s): LAPAROSCOPIC TUBAL LIGATION (Bilateral salpyngectomy) as a surgical intervention.  The patient's history has been reviewed, patient examined, no change in status, stable for surgery.  I have reviewed the patient's chart and labs.  Questions were answered to the patient's satisfaction.     [name]

## 2018-12-26 NOTE — Anesthesia Postprocedure Evaluation (Signed)
Anesthesia Post Note  Patient: Katherine Cox  Procedure(s) Performed: LAPAROSCOPIC BILATERAL SALPINGECTOMY (Bilateral Abdomen) INTRAUTERINE DEVICE (IUD) REMOVAL (Uterus)     Patient location during evaluation: PACU Anesthesia Type: General Level of consciousness: awake and alert Pain management: pain level controlled Vital Signs Assessment: post-procedure vital signs reviewed and stable Respiratory status: spontaneous breathing, nonlabored ventilation, respiratory function stable and patient connected to nasal cannula oxygen Cardiovascular status: blood pressure returned to baseline and stable Postop Assessment: no apparent nausea or vomiting Anesthetic complications: no    Last Vitals:  Vitals:   12/26/18 1545 12/26/18 1600  BP: (!) 96/55 (!) 111/56  Pulse: 76 71  Resp: 19 19  Temp:    SpO2: 99% 100%    Last Pain:  Vitals:   12/26/18 1530  TempSrc:   PainSc: Asleep                 Marshun Duva S

## 2018-12-26 NOTE — Anesthesia Procedure Notes (Signed)
Procedure Name: Intubation Date/Time: 12/26/2018 2:15 PM Performed by: Raenette Rover, CRNA Pre-anesthesia Checklist: Patient identified, Emergency Drugs available, Suction available and Patient being monitored Patient Re-evaluated:Patient Re-evaluated prior to induction Oxygen Delivery Method: Circle system utilized Preoxygenation: Pre-oxygenation with 100% oxygen Induction Type: IV induction Ventilation: Mask ventilation without difficulty and Oral airway inserted - appropriate to patient size Laryngoscope Size: Mac and 3 Grade View: Grade I Tube type: Oral Tube size: 7.0 mm Number of attempts: 3 Airway Equipment and Method: Stylet Placement Confirmation: ETT inserted through vocal cords under direct vision,  positive ETCO2,  CO2 detector and breath sounds checked- equal and bilateral Secured at: 21 cm Tube secured with: Tape Dental Injury: Teeth and Oropharynx as per pre-operative assessment

## 2018-12-26 NOTE — Transfer of Care (Signed)
Immediate Anesthesia Transfer of Care Note  Patient: Katherine Cox  Procedure(s) Performed: LAPAROSCOPIC BILATERAL SALPINGECTOMY (Bilateral Abdomen) INTRAUTERINE DEVICE (IUD) REMOVAL (Uterus)  Patient Location: PACU  Anesthesia Type:General  Level of Consciousness: drowsy and patient cooperative  Airway & Oxygen Therapy: Patient Spontanous Breathing and Patient connected to face mask oxygen  Post-op Assessment: Report given to RN and Post -op Vital signs reviewed and stable  Post vital signs: Reviewed and stable  Last Vitals:  Vitals Value Taken Time  BP 109/52 12/26/18 1530  Temp    Pulse 78 12/26/18 1534  Resp 19 12/26/18 1535  SpO2 99 % 12/26/18 1534  Vitals shown include unvalidated device data.  Last Pain:  Vitals:   12/26/18 1243  TempSrc: Oral  PainSc: 2       Patients Stated Pain Goal: 2 (16/10/96 0454)  Complications: No apparent anesthesia complications

## 2018-12-26 NOTE — Anesthesia Preprocedure Evaluation (Signed)
Anesthesia Evaluation  Patient identified by MRN, date of birth, ID band Patient awake    Reviewed: Allergy & Precautions, NPO status , Patient's Chart, lab work & pertinent test results  Airway Mallampati: II  TM Distance: >3 FB Neck ROM: Full    Dental no notable dental hx.    Pulmonary neg pulmonary ROS,    Pulmonary exam normal breath sounds clear to auscultation       Cardiovascular negative cardio ROS Normal cardiovascular exam Rhythm:Regular Rate:Normal     Neuro/Psych negative neurological ROS  negative psych ROS   GI/Hepatic negative GI ROS, Neg liver ROS,   Endo/Other  Morbid obesity  Renal/GU negative Renal ROS  negative genitourinary   Musculoskeletal negative musculoskeletal ROS (+)   Abdominal   Peds negative pediatric ROS (+)  Hematology negative hematology ROS (+)   Anesthesia Other Findings   Reproductive/Obstetrics negative OB ROS                             Anesthesia Physical Anesthesia Plan  ASA: II  Anesthesia Plan: General   Post-op Pain Management:    Induction: Intravenous  PONV Risk Score and Plan: 3 and Ondansetron, Dexamethasone and Treatment may vary due to age or medical condition  Airway Management Planned: Oral ETT  Additional Equipment:   Intra-op Plan:   Post-operative Plan: Extubation in OR  Informed Consent: I have reviewed the patients History and Physical, chart, labs and discussed the procedure including the risks, benefits and alternatives for the proposed anesthesia with the patient or authorized representative who has indicated his/her understanding and acceptance.     Dental advisory given  Plan Discussed with: CRNA and Surgeon  Anesthesia Plan Comments:         Anesthesia Quick Evaluation

## 2018-12-26 NOTE — Op Note (Signed)
Preoperative diagnosis: Desire for sterilization  Postoperative diagnosis: Same  Anesthesia: Gen.  Anesthesiologist: Dr. Kalman Shan  Procedure:Laparoscopic sterilization with bilateral salpyngectomy  Surgeon: Dr. Katharine Look Celese Banner  Estimated blood loss: Minimal  Procedure:  After being informed of the planned procedure with possible complications including bleeding, infection, injury to other organs, irreversibility of sterilization, informed consent is obtained and the patient is taken to OR # 5. She is placed in lithotomy position, prepped and draped in a sterile fashion, and a Foley catheter is inserted in her bladder.  Pelvic exam: Normal external genitalia, normal cervix, normal uterus, normal adnexa  A speculum is inserted in the vagina and the anterior lip of the cervix was grasped with tenaculum forcep. An acorn intrauterine manipulator is easily positioned and the speculum is removed.  We infiltrate the umbilical area using 10 cc of Marcaine 0.25 and perform a semi-elliptical incision which is brought down bluntly to the fascia. The fascia is identified and grasped with Coker forceps. The fascia is opened with Mayo scissors. Peritoneum is entered bluntly. A pursestring suture of 0 Vicryl is placed on the fascia. A 10 mm Hassan trocar is easily inserted in the abdominal cavity and is held in place both by its inflated balloon and the previously placed pursestring suture. This allows for easy insufflation of the pneumoperitoneum using warm CO2 at a maximum pressure of 15 mmHg. A  laparoscope is inserted in the abdominal cavity. Two 5 mm trocars are inserted in the lower abdomen under direct visualization,after infiltration each site with 10 cc of Marcaine 0.25%  Observation: Anterior cul-de-sac is normal, posterior cul-de-sac is normal, uterus is normal. Both tubes and ovaries are normal. Appendix is not visualized . Liver is visualized and normal. Gallbladder is not visualized.  We proceed  with bilateral salpyngectomy using Gyrus which allows to cauterize and section the mesosalpynx of each tube from the fimbriae to the uterine insertion. Each tube is detached and removed from the abdomen. Hemostasis is adequate.  All instruments are then removed after evacuating the pneumoperitoneum. The fascia of the umbilical incision is closed with the purse string suture of 0 Vicryl. The skin is closed with a subcuticular suture of 3-0 Monocryl and Dermabond.  Instrument and sponge count is complete x2. Estimated blood loss is minimal.The procedure is well tolerated by the patient is taken to recovery room in a well and stable condition.    Specimen: 2 tubes to pathology  Alwyn Pea  MD ATD@ [phone]

## 2018-12-27 ENCOUNTER — Encounter (HOSPITAL_BASED_OUTPATIENT_CLINIC_OR_DEPARTMENT_OTHER): Payer: Self-pay | Admitting: Obstetrics and Gynecology

## 2019-04-23 ENCOUNTER — Other Ambulatory Visit: Payer: Self-pay | Admitting: Cardiology

## 2019-04-23 DIAGNOSIS — Z20822 Contact with and (suspected) exposure to covid-19: Secondary | ICD-10-CM

## 2019-04-25 LAB — NOVEL CORONAVIRUS, NAA: SARS-CoV-2, NAA: NOT DETECTED

## 2020-03-27 ENCOUNTER — Encounter (HOSPITAL_COMMUNITY): Payer: Self-pay | Admitting: Emergency Medicine

## 2020-03-27 ENCOUNTER — Ambulatory Visit (INDEPENDENT_AMBULATORY_CARE_PROVIDER_SITE_OTHER): Payer: BC Managed Care – PPO

## 2020-03-27 ENCOUNTER — Ambulatory Visit (INDEPENDENT_AMBULATORY_CARE_PROVIDER_SITE_OTHER): Payer: 59 | Admitting: Family Medicine

## 2020-03-27 ENCOUNTER — Other Ambulatory Visit: Payer: Self-pay

## 2020-03-27 ENCOUNTER — Ambulatory Visit (HOSPITAL_COMMUNITY)
Admission: EM | Admit: 2020-03-27 | Discharge: 2020-03-27 | Disposition: A | Discharge status: Home / Self Care | Payer: BC Managed Care – PPO | Attending: Family Medicine | Admitting: Family Medicine

## 2020-03-27 DIAGNOSIS — M5431 Sciatica, right side: Secondary | ICD-10-CM

## 2020-03-27 DIAGNOSIS — T148XXA Other injury of unspecified body region, initial encounter: Secondary | ICD-10-CM | POA: Diagnosis not present

## 2020-03-27 DIAGNOSIS — M545 Low back pain, unspecified: Secondary | ICD-10-CM | POA: Diagnosis not present

## 2020-03-27 MED ORDER — PREDNISONE 10 MG (21) PO TBPK: ORAL_TABLET | ORAL | 0 refills | Status: AC

## 2020-03-27 MED ORDER — CYCLOBENZAPRINE HCL 5 MG PO TABS: 5.0000 mg | ORAL_TABLET | Freq: Three times a day (TID) | ORAL | 0 refills | Status: AC | PRN

## 2020-03-27 NOTE — ED Provider Notes (Signed)
[facility]    CSN: [id number] Arrival date & time: 03/27/20  0807      History   Chief Complaint Chief Complaint  Patient presents with  . Back Pain    HPI Katherine Cox is a 44 y.o. female.   Patient is 43 year old female who presents today for lower back pain.  This started Saturday after moving boxes.  Reporting during the movement when she felt a pull in the back.  Is concerned because he has been using over-the-counter pain meds, ice and heat without much relief.  Feels like the pain is worsening.  The pain radiates into the right buttocks area.  Denies any numbness, tingling, weakness, loss of bowel or bladder function.     Past Medical History:  Diagnosis Date  . BMI 40.0-44.9, adult (HCC)   . Migraines     Patient Active Problem List   Diagnosis Date Noted  . Allergic rhinitis 10/21/2015  . Migraine 10/21/2015  . Morbid obesity (HCC) 10/21/2015  . Vitamin D deficiency 10/21/2015  . Lateral epicondylitis of left elbow 10/21/2015  . Elevated white blood cell count 10/21/2015    Past Surgical History:  Procedure Laterality Date  . CESAREAN SECTION     2 C-sections  . IUD REMOVAL  12/26/2018   Procedure: INTRAUTERINE DEVICE (IUD) REMOVAL;  Surgeon: Silverio Lay, MD;  Location: [facility];  Service: Gynecology;;  . LAPAROSCOPIC BILATERAL SALPINGECTOMY Bilateral 12/26/2018   Procedure: LAPAROSCOPIC BILATERAL SALPINGECTOMY;  Surgeon: Silverio Lay, MD;  Location: [facility];  Service: Gynecology;  Laterality: Bilateral;  . TONSILLECTOMY      OB History   No obstetric history on file.      Home Medications    Prior to Admission medications   Medication Sig Start Date End Date Taking? Authorizing Provider  azelastine (ASTELIN) 0.1 % nasal spray U 2 SPRAYS IEN BID 09/26/15   [provider]  cyclobenzaprine (FLEXERIL) 5 MG tablet Take 1 tablet (5 mg total) by mouth 3 (three) times daily as needed for  muscle spasms. 03/27/20   Dahlia Byes A, NP  ibuprofen (ADVIL,MOTRIN) 800 MG tablet  09/05/15   [provider]  loratadine (CLARITIN) 10 MG tablet Take 10 mg by mouth daily.    [provider]  montelukast (SINGULAIR) 10 MG tablet  09/26/15   [provider]  Multiple Vitamins-Minerals (MULTIVITAMIN WITH MINERALS) tablet Take 1 tablet by mouth daily.    [provider]  ondansetron (ZOFRAN-ODT) 4 MG disintegrating tablet Take 4 mg by mouth every 8 (eight) hours as needed for nausea or vomiting.    [provider]  predniSONE (STERAPRED UNI-PAK 21 TAB) 10 MG (21) TBPK tablet 6 tabs for 1 day, then 5 tabs for 1 das, then 4 tabs for 1 day, then 3 tabs for 1 day, 2 tabs for 1 day, then 1 tab for 1 day 03/27/20   Dahlia Byes A, NP  SUMAtriptan (IMITREX) 100 MG tablet Take 100 mg by mouth every 2 (two) hours as needed for migraine. May repeat in 2 hours if headache persists or recurs.    [provider]  traMADol (ULTRAM) 50 MG tablet Take 1 tablet (50 mg total) by mouth every 6 (six) hours as needed for moderate pain. 12/26/18   Silverio Lay, MD    Family History History reviewed. No pertinent family history.  Social History Social History   Tobacco Use  . Smoking status: Never Smoker  . Smokeless tobacco:  Never Used  Vaping Use  . Vaping Use: Never used  Substance Use Topics  . Alcohol use: Never  . Drug use: Never     Allergies   Codeine and Penicillins   Review of Systems Review of Systems   Physical Exam Triage Vital Signs ED Triage Vitals  Enc Vitals Group     BP 03/27/20 0831 124/66     Pulse Rate 03/27/20 0831 74     Resp 03/27/20 0831 18     Temp 03/27/20 0831 98.9 F (37.2 C)     Temp Source 03/27/20 0831 Oral     SpO2 03/27/20 0831 98 %     Weight --      Height --      Head Circumference --      Peak Flow --      Pain Score 03/27/20 0830 6     Pain Loc --      Pain Edu? --      Excl. in GC? --    No  data found.  Updated Vital Signs BP 124/66 (BP Location: Left Wrist)   Pulse 74   Temp 98.9 F (37.2 C) (Oral)   Resp 18   LMP 03/13/2020 (Approximate)   SpO2 98%   Visual Acuity Right Eye Distance:   Left Eye Distance:   Bilateral Distance:    Right Eye Near:   Left Eye Near:    Bilateral Near:     Physical Exam Vitals and nursing note reviewed.  Constitutional:      General: She is not in acute distress.    Appearance: Normal appearance. She is not ill-appearing, toxic-appearing or diaphoretic.  HENT:     Head: Normocephalic.     Nose: Nose normal.  Eyes:     Conjunctiva/sclera: Conjunctivae normal.  Pulmonary:     Effort: Pulmonary effort is normal.  Musculoskeletal:     Cervical back: Normal range of motion.     Lumbar back: Tenderness present. No swelling or bony tenderness. Decreased range of motion.       Back:  Skin:    General: Skin is warm and dry.     Findings: No rash.  Neurological:     Mental Status: She is alert.  Psychiatric:        Mood and Affect: Mood normal.      UC Treatments / Results  Labs (all labs ordered are listed, but only abnormal results are displayed) Labs Reviewed - No data to display  EKG   Radiology DG Lumbar Spine Complete  Result Date: 03/27/2020 CLINICAL DATA:  Progressive low back pain EXAM: LUMBAR SPINE - COMPLETE 4+ VIEW COMPARISON:  None. FINDINGS: Frontal, lateral, spot lumbosacral lateral, and bilateral oblique views were obtained. There are 5 non-rib-bearing lumbar type vertebral bodies. There is mild lower lumbar levoscoliosis. There is no appreciable fracture or spondylolisthesis. There is slight disc space narrowing at L3-4. Other disc spaces appear unremarkable. There is mild facet osteoarthritic change at L5-S1 on the right. Other facets appear unremarkable. IMPRESSION: Mild scoliosis. Mild disc space narrowing at L3-4. 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 Other disc spaces appear  unremarkable. No fracture or spondylolisthesis. Electronically Signed   By: Bretta Bang III M.D.   On: 03/27/2020 09:16    Procedures Procedures (including critical care time)  Medications Ordered in UC Medications - No data to display  Initial Impression / Assessment and Plan / UC Course  I have reviewed the triage vital  signs and the nursing notes.  Pertinent labs & imaging results that were available during my care of the patient were reviewed by me and considered in my medical decision making (see chart for details).     Muscle strain with sciatic nerve pain X-ray without any concerns.  Some arthritis.  Nothing acute Treatment prednisone taper over the next 6 days.  Take this with food.  Flexeril up to 3 times a day as needed for muscle spasms.  Information given on rehab Follow up as needed for continued or worsening symptoms  Final Clinical Impressions(s) / UC Diagnoses   Final diagnoses:  Muscle strain  Sciatica of right side     Discharge Instructions     X-ray without any concerns.  You do have some arthritis in your back. Treating with prednisone taper over the next 6 days.  Take this with food.  Flexeril up to 3 times a day as needed for muscle spasms Information given on stretches to do and rehab    ED Prescriptions    Medication Sig Dispense Auth. Provider   predniSONE (STERAPRED UNI-PAK 21 TAB) 10 MG (21) TBPK tablet 6 tabs for 1 day, then 5 tabs for 1 das, then 4 tabs for 1 day, then 3 tabs for 1 day, 2 tabs for 1 day, then 1 tab for 1 day 21 tablet Rayonna Heldman A, NP   cyclobenzaprine (FLEXERIL) 5 MG tablet Take 1 tablet (5 mg total) by mouth 3 (three) times daily as needed for muscle spasms. 30 tablet Dahlia Byes A, NP     PDMP not reviewed this encounter.   Janace Aris, NP 03/27/20 1421

## 2020-03-27 NOTE — Discharge Instructions (Addendum)
X-ray without any concerns.  You do have some arthritis in your back. Treating with prednisone taper over the next 6 days.  Take this with food.  Flexeril up to 3 times a day as needed for muscle spasms Information given on stretches to do and rehab

## 2020-03-27 NOTE — ED Triage Notes (Signed)
Pt presents with low back pain xs 5 days. States was moving boxes. Has used OTC pain meds, ice and heat with minimal relief.

## 2020-04-10 ENCOUNTER — Ambulatory Visit (INDEPENDENT_AMBULATORY_CARE_PROVIDER_SITE_OTHER): Payer: 59 | Admitting: Family Medicine

## 2021-06-02 IMAGING — DX DG LUMBAR SPINE COMPLETE 4+V
5 series · 5 of 5 positions shown · non-contrast
Comparison: None.

CLINICAL DATA: Progressive low back pain

EXAM:
LUMBAR SPINE - COMPLETE 4+ VIEW

[l-spine ap]
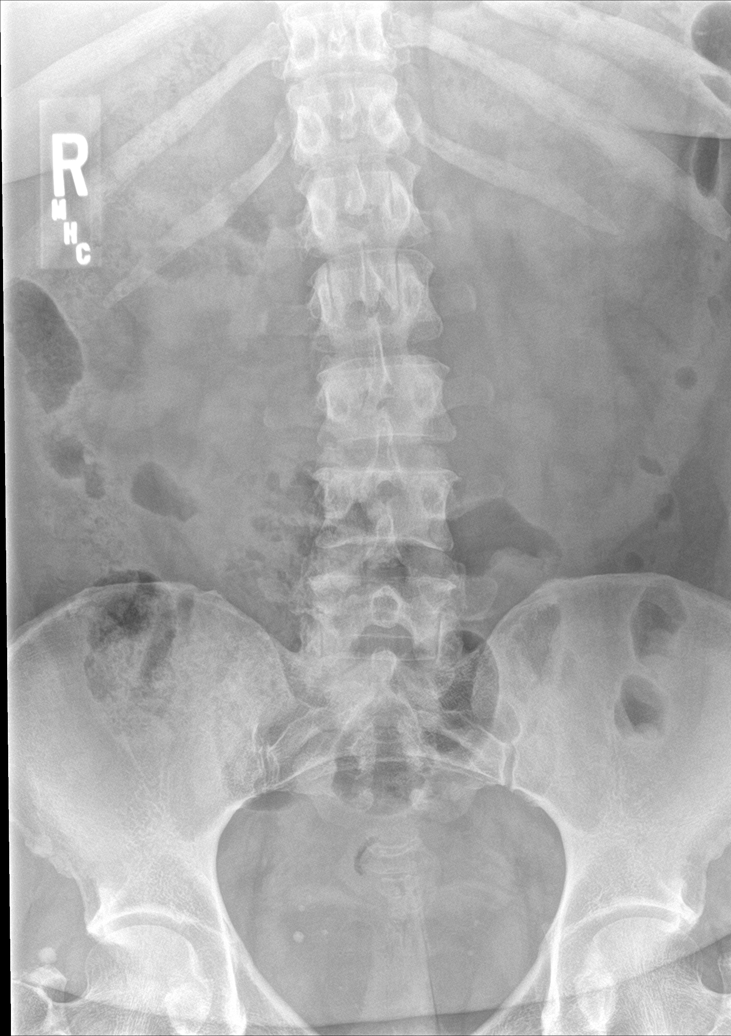

[l-spine obl (1 of 2)]
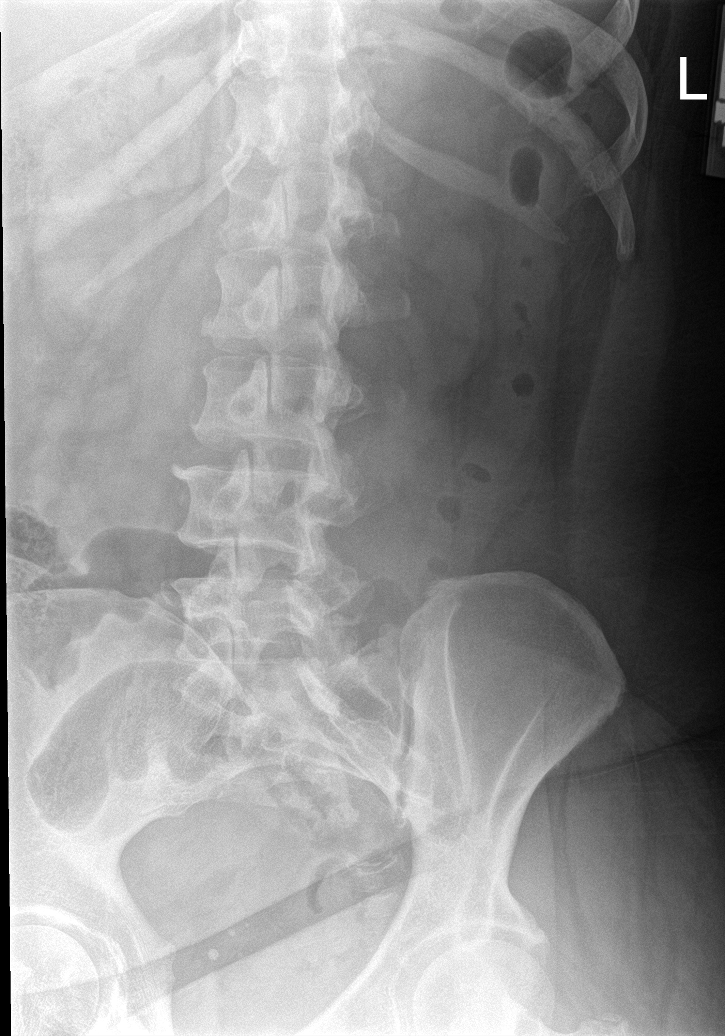

[l-spine obl (2 of 2)]
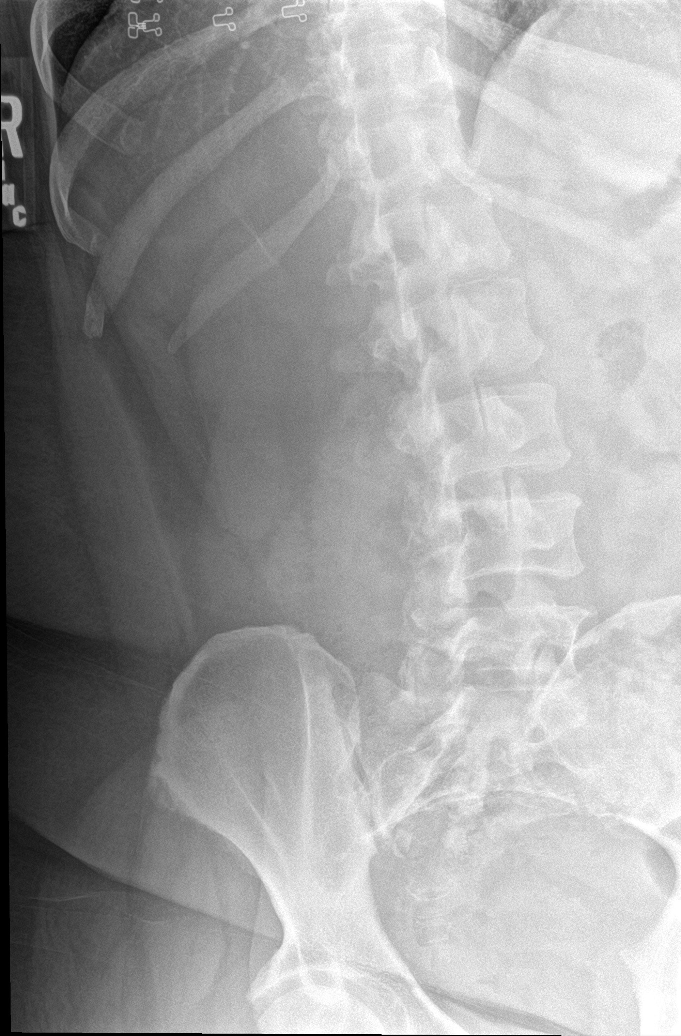

[l-spine lat]
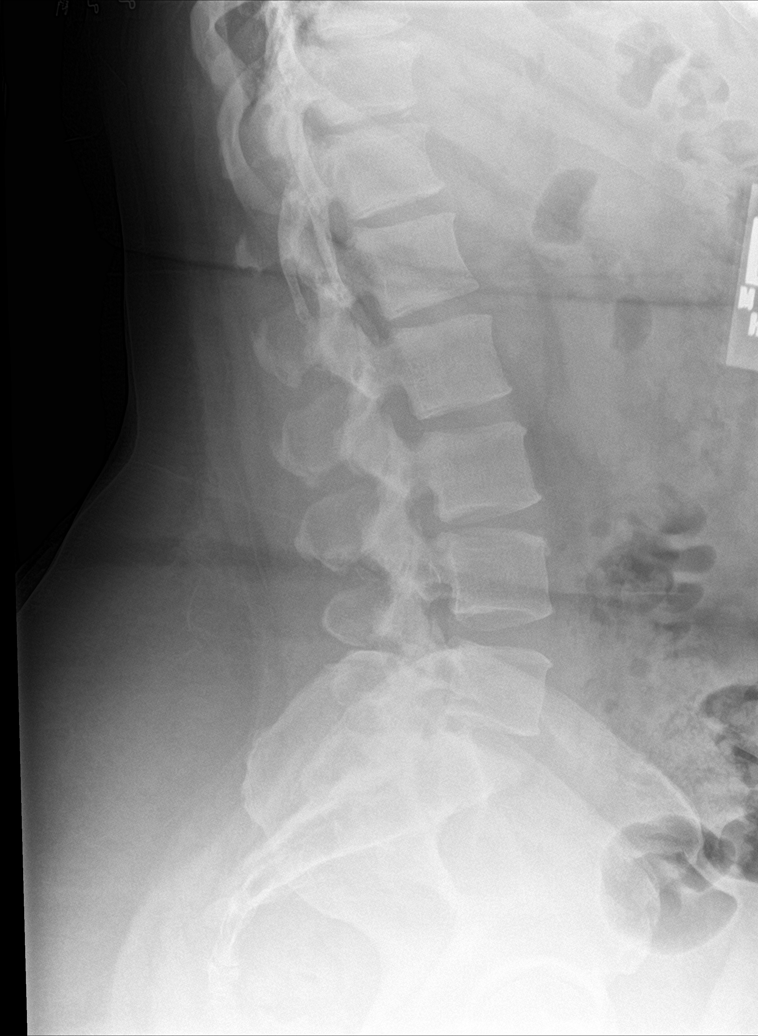

[l-spine spot]
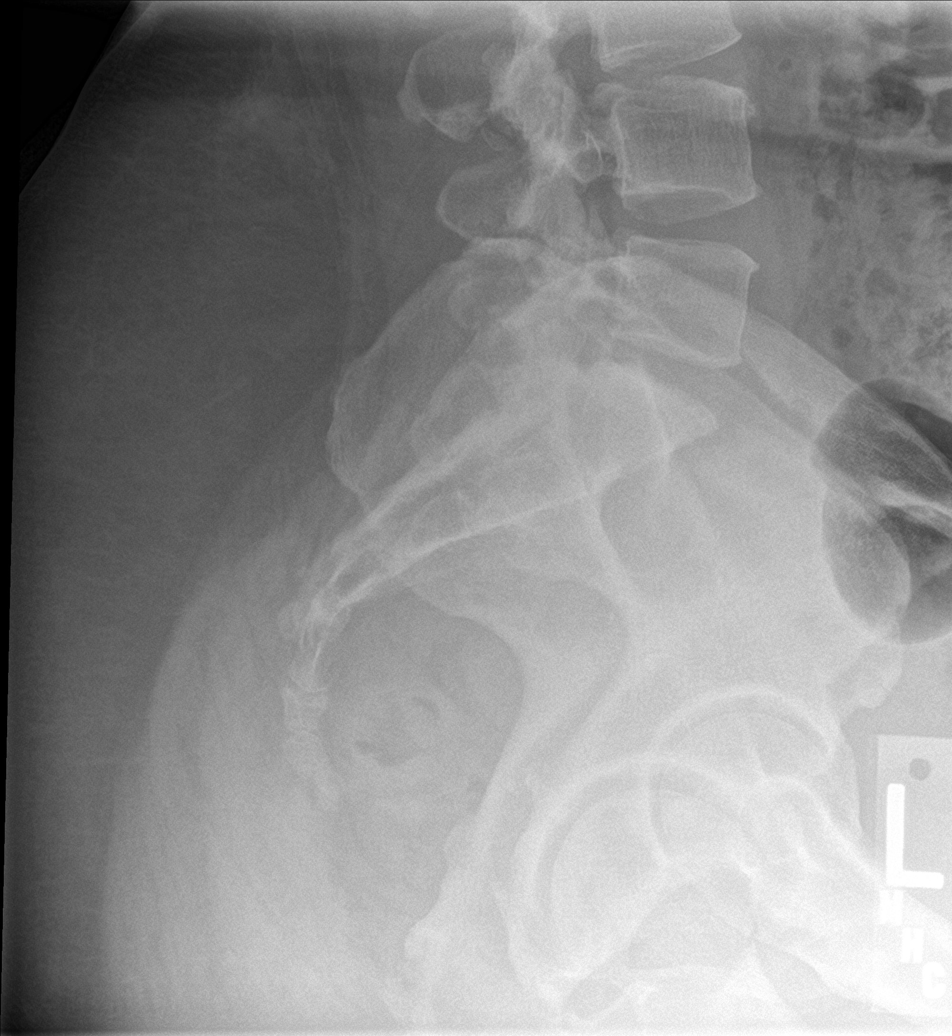

[5 of 5 positions shown; findings below may reference images not displayed]

FINDINGS: Frontal, lateral, spot lumbosacral lateral, and bilateral oblique
views were obtained. There are 5 non-rib-bearing lumbar type
vertebral bodies. There is mild lower lumbar levoscoliosis. There is
no appreciable fracture or spondylolisthesis. There is slight disc
space narrowing at L3-4. Other disc spaces appear unremarkable.
There is mild facet osteoarthritic change at L5-S1 on the right.
Other facets appear unremarkable.
IMPRESSION: Mild scoliosis. Mild disc space narrowing at L3-4. Mild facet
osteoarthritic change on the right at L5-S1. Other disc spaces
appear unremarkable. No fracture or spondylolisthesis.

## 2022-01-06 ENCOUNTER — Encounter (INDEPENDENT_AMBULATORY_CARE_PROVIDER_SITE_OTHER): Payer: Self-pay

## 2022-01-07 ENCOUNTER — Encounter: Payer: Self-pay | Admitting: Gastroenterology

## 2022-01-19 ENCOUNTER — Telehealth: Payer: Self-pay | Admitting: *Deleted

## 2022-01-19 NOTE — Telephone Encounter (Signed)
No return call received,procedure and pre-visit cancelled,no shoe letter sent via my chart and mailed.

## 2022-01-19 NOTE — Telephone Encounter (Signed)
Attempted to call x 3 message left with call back # at mobile and home phone to return call by 5 pm today to reschedule pre-visit and if no return call by 5 pm we will have to cancel upcoming procedure on 02/22/22

## 2022-02-22 ENCOUNTER — Encounter: Payer: BC Managed Care – PPO | Admitting: Gastroenterology

## 2022-12-20 ENCOUNTER — Ambulatory Visit: Payer: BC Managed Care – PPO

## 2022-12-20 ENCOUNTER — Encounter: Payer: Self-pay | Admitting: Podiatry

## 2022-12-20 ENCOUNTER — Other Ambulatory Visit: Payer: Self-pay | Admitting: Podiatry

## 2022-12-20 ENCOUNTER — Ambulatory Visit (INDEPENDENT_AMBULATORY_CARE_PROVIDER_SITE_OTHER): Payer: BC Managed Care – PPO

## 2022-12-20 ENCOUNTER — Ambulatory Visit: Payer: BC Managed Care – PPO | Admitting: Podiatry

## 2022-12-20 DIAGNOSIS — M79672 Pain in left foot: Secondary | ICD-10-CM

## 2022-12-20 DIAGNOSIS — M722 Plantar fascial fibromatosis: Secondary | ICD-10-CM

## 2022-12-20 DIAGNOSIS — M79671 Pain in right foot: Secondary | ICD-10-CM | POA: Diagnosis not present

## 2022-12-20 MED ORDER — TRIAMCINOLONE ACETONIDE 10 MG/ML IJ SUSP
2.5000 mg | Freq: Once | INTRAMUSCULAR | Status: AC
  Administered 2022-12-20: 2.5 mg via INTRAMUSCULAR

## 2022-12-20 MED ORDER — DEXAMETHASONE SODIUM PHOSPHATE 120 MG/30ML IJ SOLN
4.0000 mg | Freq: Once | INTRAMUSCULAR | Status: AC
  Administered 2022-12-20: 4 mg via INTRA_ARTICULAR

## 2022-12-20 MED ORDER — MELOXICAM 15 MG PO TABS: 15.0000 mg | ORAL_TABLET | Freq: Every day | ORAL | 0 refills | Status: DC

## 2022-12-20 NOTE — Patient Instructions (Signed)

## 2022-12-20 NOTE — Progress Notes (Signed)
  Subjective:  Patient ID: Katherine Cox, female    DOB: [birth year],   MRN: [id number]  Chief Complaint  Patient presents with   Foot Pain    Patient came in today for bilateral heel pain,  and top of the foot pain, started over 5 years ago, the pain has increased in the last 3 months, rate of pain 5 out of 10, X-Rays done today      47 y.o. female presents for concern as above. Denies any current treatments.  . Denies any other pedal complaints. Denies n/v/f/c.   Past Medical History:  Diagnosis Date   BMI 40.0-44.9, adult (HCC)    Migraines     Objective:  Physical Exam: Vascular: DP/PT pulses 2/4 bilateral. CFT <3 seconds. Normal hair growth on digits. No edema.  Skin. No lacerations or abrasions bilateral feet.  Musculoskeletal: MMT 5/5 bilateral lower extremities in DF, PF, Inversion and Eversion. Deceased ROM in DF of ankle joint. Tender to bilateral medial calcaneal tubercles. No pain along PT or arch. Some pain along right achilles. No pain with calcaneal squeeze bilateral.  Neurological: Sensation intact to light touch.   Assessment:   1. Left foot pain   2. Right foot pain   3. Bilateral plantar fasciitis      Plan:  Patient was evaluated and treated and all questions answered. Discussed plantar fasciitis with patient.  X-rays reviewed and discussed with patient. No acute fractures or dislocations noted. Mild spurring noted at inferior calcaneus. Spurring noted to posterior calcaneus as well mostly on left.  Discussed treatment options including, ice, NSAIDS, supportive shoes, bracing, and stretching. Stretching exercises provided to be done on a daily basis.   Prescription for meloxicam provided and sent to pharmacy. Patient requesting injection today. Procedure note below.   Follow-up 6 weeks or sooner if any problems arise. In the meantime, encouraged to call the office with any questions, concerns, change in symptoms.   Procedure:  Discussed etiology,  pathology, conservative vs. surgical therapies. At this time a plantar fascial injection was recommended.  The patient agreed and a sterile skin prep was applied.  An injection consisting of  1cc dexamethasone 0.5 cc kenalog and 1cc marcaine mixture was infiltrated at the point of maximal tenderness on the bilateral Heel.  Bandaid applied. The patient tolerated this well and was given instructions for aftercare.    Louann Sjogren, DPM

## 2023-01-22 ENCOUNTER — Other Ambulatory Visit: Payer: Self-pay | Admitting: Podiatry

## 2023-02-07 ENCOUNTER — Ambulatory Visit (INDEPENDENT_AMBULATORY_CARE_PROVIDER_SITE_OTHER): Payer: BC Managed Care – PPO | Admitting: Podiatry

## 2023-02-07 ENCOUNTER — Encounter: Payer: Self-pay | Admitting: Podiatry

## 2023-02-07 DIAGNOSIS — M722 Plantar fascial fibromatosis: Secondary | ICD-10-CM

## 2023-02-07 NOTE — Progress Notes (Signed)
  Subjective:  Patient ID: Katherine Cox, female    DOB: 1975/12/27,   MRN: [id number]  No chief complaint on file.   47 y.o. female presents for follow-up of plantar fasciitis. Relates she was doing well until her cruise and the pain returned. Slightly different spot on the back mostly in the right heel. She has been stretching but still hurting.  . Denies any other pedal complaints. Denies n/v/f/c.   Past Medical History:  Diagnosis Date   BMI 40.0-44.9, adult (HCC)    Migraines     Objective:  Physical Exam: Vascular: DP/PT pulses 2/4 bilateral. CFT <3 seconds. Normal hair growth on digits. No edema.  Skin. No lacerations or abrasions bilateral feet.  Musculoskeletal: MMT 5/5 bilateral lower extremities in DF, PF, Inversion and Eversion. Deceased ROM in DF of ankle joint. Tender to bilateral medial calcaneal tubercles. No pain along PT or arch. Some pain along right achilles. No pain with calcaneal squeeze bilateral.  More pain along achilles on right today.  Neurological: Sensation intact to light touch.   Assessment:   1. Bilateral plantar fasciitis      Plan:  Patient was evaluated and treated and all questions answered. Discussed plantar fasciitis with patient.  X-rays reviewed and discussed with patient. No acute fractures or dislocations noted. Mild spurring noted at inferior calcaneus. Spurring noted to posterior calcaneus as well mostly on left.  Continue stretching and anti-inflammatories.  Discussed custom orthotics and patient will look into.  Discussed PT and patient will call if she decides she wants to do this.   Follow-up as needed     Louann Sjogren, DPM

## 2023-02-15 ENCOUNTER — Other Ambulatory Visit: Payer: Self-pay | Admitting: Podiatry

## 2023-02-15 DIAGNOSIS — M79671 Pain in right foot: Secondary | ICD-10-CM

## 2023-02-15 DIAGNOSIS — M79672 Pain in left foot: Secondary | ICD-10-CM

## 2023-02-15 DIAGNOSIS — M722 Plantar fascial fibromatosis: Secondary | ICD-10-CM

## 2023-02-23 ENCOUNTER — Other Ambulatory Visit: Payer: Self-pay | Admitting: Podiatry

## 2023-04-02 ENCOUNTER — Other Ambulatory Visit: Payer: Self-pay | Admitting: Podiatry

## 2024-05-30 ENCOUNTER — Other Ambulatory Visit: Payer: Self-pay | Admitting: Family Medicine

## 2024-05-30 DIAGNOSIS — Z1231 Encounter for screening mammogram for malignant neoplasm of breast: Secondary | ICD-10-CM

## 2024-06-20 ENCOUNTER — Ambulatory Visit: Payer: Self-pay
